# Patient Record
Sex: Female | Born: 2005 | Race: Black or African American | Hispanic: No | Marital: Single | Smoking: Never smoker
Health system: Southern US, Community
[De-identification: ages and names within clinical notes are randomized; demographics above are authoritative.]

## PROBLEM LIST (undated history)

## (undated) ENCOUNTER — Emergency Department (HOSPITAL_COMMUNITY): Admission: EM | Payer: Medicaid Other | Source: Home / Self Care

## (undated) DIAGNOSIS — J4520 Mild intermittent asthma, uncomplicated: Secondary | ICD-10-CM

## (undated) DIAGNOSIS — D573 Sickle-cell trait: Secondary | ICD-10-CM

## (undated) DIAGNOSIS — R51 Headache: Secondary | ICD-10-CM

## (undated) DIAGNOSIS — R29898 Other symptoms and signs involving the musculoskeletal system: Secondary | ICD-10-CM

## (undated) DIAGNOSIS — R519 Headache, unspecified: Secondary | ICD-10-CM

## (undated) HISTORY — DX: Other symptoms and signs involving the musculoskeletal system: R29.898

## (undated) HISTORY — DX: Mild intermittent asthma, uncomplicated: J45.20

## (undated) HISTORY — DX: Headache, unspecified: R51.9

## (undated) HISTORY — DX: Sickle-cell trait: D57.3

## (undated) HISTORY — DX: Headache: R51

---
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
## 2024-07-07 ENCOUNTER — Encounter: Payer: Self-pay | Admitting: *Deleted

## 2024-07-17 ENCOUNTER — Ambulatory Visit: Payer: Self-pay

## 2024-07-17 VITALS — BP 105/72 | HR 102

## 2024-07-17 DIAGNOSIS — R300 Dysuria: Secondary | ICD-10-CM

## 2024-07-17 LAB — POCT URINALYSIS DIPSTICK
Bilirubin, UA: NEGATIVE
Glucose, UA: NEGATIVE
Protein, UA: NEGATIVE
Spec Grav, UA: 1.02 (ref 1.010–1.025)
Urobilinogen, UA: 0.2 U/dL
pH, UA: 7.5 (ref 5.0–8.0)

## 2024-07-17 MED ORDER — NITROFURANTOIN MONOHYD MACRO 100 MG PO CAPS: 100.0000 mg | ORAL_CAPSULE | Freq: Two times a day (BID) | ORAL | 0 refills | Status: DC

## 2024-07-17 NOTE — Progress Notes (Signed)
 SUBJECTIVE: Candice Powell is a 18 y.o. female who complains of urinary frequency, urgency and dysuria x 3 days, without flank pain, fever, chills, or abnormal vaginal discharge or bleeding.   OBJECTIVE: Appears well, in no apparent distress.  Vital signs are normal. Urine dipstick shows positive for nitrates, positive for leukocytes, and positive for ketones.    ASSESSMENT: Dysuria  PLAN: Treatment per orders.  Call or return to clinic prn if these symptoms worsen or fail to improve as anticipated.

## 2024-07-20 LAB — URINE CULTURE

## 2024-07-21 ENCOUNTER — Ambulatory Visit: Payer: Self-pay | Admitting: Obstetrics and Gynecology

## 2024-08-12 ENCOUNTER — Emergency Department (HOSPITAL_COMMUNITY)
Admission: EM | Admit: 2024-08-12 | Discharge: 2024-08-12 | Disposition: A | Discharge status: Home / Self Care | Attending: Emergency Medicine | Admitting: Emergency Medicine

## 2024-08-12 ENCOUNTER — Other Ambulatory Visit: Payer: Self-pay

## 2024-08-12 ENCOUNTER — Encounter (HOSPITAL_COMMUNITY): Payer: Self-pay | Admitting: Pharmacy Technician

## 2024-08-12 DIAGNOSIS — E876 Hypokalemia: Secondary | ICD-10-CM | POA: Insufficient documentation

## 2024-08-12 DIAGNOSIS — J45909 Unspecified asthma, uncomplicated: Secondary | ICD-10-CM | POA: Diagnosis not present

## 2024-08-12 DIAGNOSIS — R112 Nausea with vomiting, unspecified: Secondary | ICD-10-CM | POA: Insufficient documentation

## 2024-08-12 LAB — COMPREHENSIVE METABOLIC PANEL WITH GFR
ALT: 7 U/L (ref 0–44)
AST: 13 U/L — ABNORMAL LOW (ref 15–41)
Albumin: 4.1 g/dL (ref 3.5–5.0)
Alkaline Phosphatase: 54 U/L (ref 38–126)
Anion gap: 10 (ref 5–15)
BUN: 7 mg/dL (ref 6–20)
CO2: 24 mmol/L (ref 22–32)
Calcium: 9.3 mg/dL (ref 8.9–10.3)
Chloride: 106 mmol/L (ref 98–111)
Creatinine, Ser: 0.79 mg/dL (ref 0.44–1.00)
GFR, Estimated: >60 mL/min (ref >60)
Glucose, Bld: 111 mg/dL — ABNORMAL HIGH (ref 70–99)
Potassium: 3.4 mmol/L — ABNORMAL LOW (ref 3.5–5.1)
Sodium: 140 mmol/L (ref 135–145)
Total Bilirubin: 0.6 mg/dL (ref 0.0–1.2)
Total Protein: 7.5 g/dL (ref 6.5–8.1)

## 2024-08-12 LAB — CBC
HCT: 35.7 % — ABNORMAL LOW (ref 36.0–46.0)
Hemoglobin: 11.6 g/dL — ABNORMAL LOW (ref 12.0–15.0)
MCH: 25.9 pg — ABNORMAL LOW (ref 26.0–34.0)
MCHC: 32.5 g/dL (ref 30.0–36.0)
MCV: 79.7 fL — ABNORMAL LOW (ref 80.0–100.0)
Platelets: 301 K/uL (ref 150–400)
RBC: 4.48 MIL/uL (ref 3.87–5.11)
RDW: 13.8 % (ref 11.5–15.5)
WBC: 6.9 K/uL (ref 4.0–10.5)
nRBC: 0 % (ref 0.0–0.2)

## 2024-08-12 LAB — HCG, SERUM, QUALITATIVE: Preg, Serum: NEGATIVE

## 2024-08-12 LAB — LIPASE, BLOOD: Lipase: 23 U/L (ref 11–51)

## 2024-08-12 MED ORDER — ONDANSETRON 4 MG PO TBDP: 4.0000 mg | ORAL_TABLET | Freq: Three times a day (TID) | ORAL | 0 refills | Status: AC | PRN

## 2024-08-12 MED ORDER — SODIUM CHLORIDE 0.9 % IV BOLUS
1000.0000 mL | Freq: Once | INTRAVENOUS | Status: AC
  Administered 2024-08-12: 1000 mL via INTRAVENOUS

## 2024-08-12 MED ORDER — ONDANSETRON HCL 4 MG/2ML IJ SOLN
4.0000 mg | Freq: Once | INTRAMUSCULAR | Status: AC
  Administered 2024-08-12: 4 mg via INTRAVENOUS
  Filled 2024-08-12: qty 2

## 2024-08-12 NOTE — ED Provider Notes (Signed)
 [place] EMERGENCY DEPARTMENT AT [facility] Provider Note   CSN: [id number] Arrival date & time: 08/12/24  1448     Patient presents with: Emesis   Candice Powell is a 18 y.o. female with a past medical history of asthma, sickle cell trait, migraines presents Emergency Department for evaluation of vomiting one hour following eating [place] shrimp Alfred[employer] at [employer] today at noon.  Reports that she has vomited 7 times since eating that.  No diarrhea nor fevers.  No complaints prior to today.  Last BM was today and normal.  Passing gas.  Denies urinary symptoms, abdominal pain, vaginal symptoms    Emesis      Prior to Admission medications   Medication Sig Start Date End Date Taking? Authorizing Provider  ondansetron  (ZOFRAN -ODT) 4 MG disintegrating tablet Take 1 tablet (4 mg total) by mouth every 8 (eight) hours as needed for nausea or vomiting. 08/12/24  Yes Minnie Tinnie BRAVO, PA  fexofenadine  (ALLEGRA ) 60 MG tablet Take 1 tablet (60 mg total) by mouth 2 (two) times daily. 03/01/22 03/31/22  Ettie Gull, MD  fluticasone  (FLONASE ) 50 MCG/ACT nasal spray Place 2 sprays into both nostrils daily. Patient not taking: Reported on 05/19/2022 09/02/21   Richad Jon HERO, NP  nitrofurantoin, macrocrystal-monohydrate, (MACROBID) 100 MG capsule Take 1 capsule (100 mg total) by mouth 2 (two) times daily. 07/17/24   Constant, Peggy, MD  norelgestromin -ethinyl estradiol  (XULANE) 150-35 MCG/24HR transdermal patch Place 1 patch onto the skin once a week. 04/07/23   Forlan, Nicole, NP  ondansetron  (ZOFRAN -ODT) 4 MG disintegrating tablet Take 1 tablet (4 mg total) by mouth every 8 (eight) hours as needed for nausea or vomiting. 12/28/23   Jerrol Agent, MD    Allergies: Patient has no known allergies.    Review of Systems  Gastrointestinal:  Positive for vomiting.    Updated Vital Signs BP 110/79   Pulse 72   Temp 98.2 F (36.8 C) (Oral)   Resp 16   Ht 5' 3 (1.6 m)   Wt 59.6 kg   SpO2  100%   BMI 23.28 kg/m   Physical Exam Vitals and nursing note reviewed.  Constitutional:      General: She is not in acute distress.    Appearance: Normal appearance.  HENT:     Head: Normocephalic and atraumatic.  Eyes:     Conjunctiva/sclera: Conjunctivae normal.  Cardiovascular:     Rate and Rhythm: Normal rate.  Pulmonary:     Effort: Pulmonary effort is normal. No respiratory distress.     Breath sounds: Normal breath sounds.  Abdominal:     General: Bowel sounds are normal. There is no distension.     Palpations: Abdomen is soft.     Tenderness: There is no abdominal tenderness.     Comments: No peritoneal signs  Skin:    Coloration: Skin is not jaundiced or pale.  Neurological:     Mental Status: She is alert and oriented to person, place, and time. Mental status is at baseline.     (all labs ordered are listed, but only abnormal results are displayed) Labs Reviewed  COMPREHENSIVE METABOLIC PANEL WITH GFR - Abnormal; Notable for the following components:      Result Value   Potassium 3.4 (*)    Glucose, Bld 111 (*)    AST 13 (*)    All other components within normal limits  CBC - Abnormal; Notable for the following components:   Hemoglobin 11.6 (*)  HCT 35.7 (*)    MCV 79.7 (*)    MCH 25.9 (*)    All other components within normal limits  LIPASE, BLOOD  HCG, SERUM, QUALITATIVE  URINALYSIS, ROUTINE W REFLEX MICROSCOPIC    EKG: None  Radiology: No results found.   Medications Ordered in the ED  ondansetron  (ZOFRAN ) injection 4 mg (4 mg Intravenous Given 08/12/24 1606)  sodium chloride  0.9 % bolus 1,000 mL (1,000 mLs Intravenous New Bag/Given 08/12/24 1830)                                    Medical Decision Making Amount and/or Complexity of Data Reviewed Labs: ordered.  Risk Prescription drug management.   Patient presents to the ED for concern of NV, this involves an extensive number of treatment options, and is a complaint that carries  with it a high risk of complications and morbidity.  The differential diagnosis includes influenza, pregnancy, gastroenteritis, appendicitis, infection, perforation, obstruction, hypokalemia, dehydration.  Not an exhaustive list   Co morbidities that complicate the patient evaluation  None   Additional history obtained:  Additional history obtained from Nursing   External records from outside source obtained and reviewed including triage RN note   Lab Tests:  I Ordered, and personally interpreted labs.  The pertinent results include:   Potassium 3.4    Medicines ordered and prescription drug management:  I ordered medication including zofran , ivf  for NV, hydration  Reevaluation of the patient after these medicines showed that the patient resolved I have reviewed the patients home medicines and have made adjustments as needed     Problem List / ED Course:  Vomiting Hemodynamically stable with no fever no tachycardia No signs of sepsis with no leukocytes, tachycardia, fever No abdominal tenderness nor peritoneal signs No acute emergent electrolyte abnormalities.  Potassium 3.4. hCG negative No vomiting since obtaining IVF, Zofran  Passes p.o. challenge Symptoms likely 2/2 gastroenteritis possible food poisoning Will provide Zofran  prescription Discussed importance of oral hydration at home and bowel rest    Reevaluation:  After the interventions noted above, I reevaluated the patient and found that they have :resolved    Dispostion:  After consideration of the diagnostic results and the patients response to treatment, I feel that the patent would benefit from outpatient management with symptomatic treatment.   Discussed ED workup, disposition, return to ED precautions with patient who expresses understanding agrees with plan.  All questions answered to their satisfaction.  They are agreeable to plan.  Discharge instructions provided on paperwork  Final  diagnoses:  Nausea and vomiting, unspecified vomiting type    ED Discharge Orders          Ordered    ondansetron  (ZOFRAN -ODT) 4 MG disintegrating tablet  Every 8 hours PRN        08/12/24 1935             Minnie Tinnie BRAVO, PA 08/13/24 1533    Franklyn Sid SAILOR, MD 08/13/24 1744

## 2024-08-12 NOTE — ED Triage Notes (Signed)
 Pt here with reports of possible food poisoning. States at at [employer] and had some shrimp approx 1 hour ago. Pt started vomiting right after eating.

## 2024-08-12 NOTE — Discharge Instructions (Addendum)
 Thank you for letting us  evaluate you today.  Your lab work was notable for a mildly decreased potassium of 3.4 (normal is above 3.5).  Your vital signs are within normal limits.  There is no fever.  Your white blood cell count is within normal limits.  You may develop some diarrhea from possible food poisoning or stomach bug and this is normal.  Please make sure to drink plenty of fluids even if you cannot eat.  You may drink water, Gatorade, Pedialyte, chicken broth  Return to Emergency Department if you experience severe debilitating abdominal pain, intractable vomiting cause you to be unable to keep fluids down, worsening symptoms

## 2024-08-12 NOTE — ED Provider Triage Note (Signed)
 Emergency Medicine Provider Triage Evaluation Note  Kassidy Dockendorf , a 18 y.o. female  was evaluated in triage.  Pt complains of who presents to the emergency department with a chief complaint of nausea and vomiting.  Patient was at [employer] earlier today and ate some shrimp, nausea and vomiting started almost immediately after this.  Denies fever, chills, diarrhea.  Currently denies any abdominal pain other than just from retching with vomiting.  States that she is otherwise healthy and denies prescription medications at home.  No hematemesis  Review of Systems  Positive: Nausea, vomiting Negative: Fever, chills, abdominal pain  Physical Exam  BP (!) 148/97 (BP Location: Right Arm)   Pulse 76   Temp 98.2 F (36.8 C) (Oral)   Resp 16   SpO2 100%  Gen:   Awake, no distress   Resp:  Normal effort  MSK:   Moves extremities without difficulty  Other:  Patient actively vomiting in triage, abdomen is soft and nontender  Medical Decision Making  Medically screening exam initiated at 3:55 PM.  Appropriate orders placed.  Lasharn Bufkin was informed that the remainder of the evaluation will be completed by another provider, this initial triage assessment does not replace that evaluation, and the importance of remaining in the ED until their evaluation is complete.  Orders: CBC, CMP, lipase, serum pregnancy, UA, Zofran  and fluids   Janetta Terrall FALCON, [place] 08/12/24 1557

## 2024-08-15 ENCOUNTER — Other Ambulatory Visit: Payer: Self-pay

## 2024-08-15 ENCOUNTER — Emergency Department (HOSPITAL_COMMUNITY)
Admission: EM | Admit: 2024-08-15 | Discharge: 2024-08-15 | Disposition: A | Discharge status: Home / Self Care | Attending: Emergency Medicine | Admitting: Emergency Medicine

## 2024-08-15 DIAGNOSIS — E876 Hypokalemia: Secondary | ICD-10-CM | POA: Insufficient documentation

## 2024-08-15 DIAGNOSIS — E860 Dehydration: Secondary | ICD-10-CM | POA: Diagnosis not present

## 2024-08-15 DIAGNOSIS — R112 Nausea with vomiting, unspecified: Secondary | ICD-10-CM | POA: Insufficient documentation

## 2024-08-15 DIAGNOSIS — R001 Bradycardia, unspecified: Secondary | ICD-10-CM | POA: Diagnosis not present

## 2024-08-15 LAB — COMPREHENSIVE METABOLIC PANEL WITH GFR
ALT: 9 U/L (ref 0–44)
AST: 14 U/L — ABNORMAL LOW (ref 15–41)
Albumin: 4.3 g/dL (ref 3.5–5.0)
Alkaline Phosphatase: 50 U/L (ref 38–126)
Anion gap: 15 (ref 5–15)
BUN: 10 mg/dL (ref 6–20)
CO2: 18 mmol/L — ABNORMAL LOW (ref 22–32)
Calcium: 9.1 mg/dL (ref 8.9–10.3)
Chloride: 105 mmol/L (ref 98–111)
Creatinine, Ser: 0.89 mg/dL (ref 0.44–1.00)
GFR, Estimated: >60 mL/min (ref >60)
Glucose, Bld: 96 mg/dL (ref 70–99)
Potassium: 3 mmol/L — ABNORMAL LOW (ref 3.5–5.1)
Sodium: 138 mmol/L (ref 135–145)
Total Bilirubin: 1.2 mg/dL (ref 0.0–1.2)
Total Protein: 7.5 g/dL (ref 6.5–8.1)

## 2024-08-15 LAB — CBC
HCT: 36.3 % (ref 36.0–46.0)
Hemoglobin: 11.9 g/dL — ABNORMAL LOW (ref 12.0–15.0)
MCH: 26.2 pg (ref 26.0–34.0)
MCHC: 32.8 g/dL (ref 30.0–36.0)
MCV: 79.8 fL — ABNORMAL LOW (ref 80.0–100.0)
Platelets: 277 K/uL (ref 150–400)
RBC: 4.55 MIL/uL (ref 3.87–5.11)
RDW: 13.6 % (ref 11.5–15.5)
WBC: 9.1 K/uL (ref 4.0–10.5)
nRBC: 0 % (ref 0.0–0.2)

## 2024-08-15 LAB — HCG, SERUM, QUALITATIVE: Preg, Serum: NEGATIVE

## 2024-08-15 MED ORDER — ONDANSETRON HCL 4 MG/2ML IJ SOLN
4.0000 mg | Freq: Once | INTRAMUSCULAR | Status: AC
  Administered 2024-08-15: 4 mg via INTRAVENOUS

## 2024-08-15 MED ORDER — ONDANSETRON HCL 4 MG/2ML IJ SOLN
INTRAMUSCULAR | Status: AC
  Filled 2024-08-15: qty 2

## 2024-08-15 MED ORDER — POTASSIUM CHLORIDE CRYS ER 20 MEQ PO TBCR
20.0000 meq | EXTENDED_RELEASE_TABLET | Freq: Once | ORAL | Status: AC
  Administered 2024-08-15: 20 meq via ORAL
  Filled 2024-08-15: qty 1

## 2024-08-15 MED ORDER — SODIUM CHLORIDE 0.9 % IV BOLUS
1000.0000 mL | Freq: Once | INTRAVENOUS | Status: AC
  Administered 2024-08-15: 1000 mL via INTRAVENOUS

## 2024-08-15 NOTE — ED Triage Notes (Signed)
 Pt was seem a few days ago due to vomiting and nausea. Was prescribed zofran  and last night and today felt nauseated and has been taking zofran  as prescribed. This morning felt heart palpitations after taking zofran . Pt states she vomited in waiting room when she got up. Pt denies any pain at this time.

## 2024-08-15 NOTE — ED Provider Notes (Signed)
 [place] EMERGENCY DEPARTMENT AT [facility] Provider Note   CSN: [id number] Arrival date & time: 08/15/24  1136     Patient presents with: Emesis   Candice Powell is a 18 y.o. female.   Patient complains of nausea and vomiting.  Patient took Zofran  at home earlier today.  Patient reports she has not been drinking very much for fluids.  Patient reports she was seen yesterday for the same.  Patient denies any diarrhea.  Patient states she has vomited twice today.  Patient denies any fever she denies any chills.  Patient is not having any diarrhea.  Patient currently does not have any abdominal pain.  Patient denies any burning with urination  The history is provided by the patient. No language interpreter was used.  Emesis      Prior to Admission medications   Medication Sig Start Date End Date Taking? Authorizing Provider  fexofenadine  (ALLEGRA ) 60 MG tablet Take 1 tablet (60 mg total) by mouth 2 (two) times daily. 03/01/22 03/31/22  Ettie Gull, MD  fluticasone  (FLONASE ) 50 MCG/ACT nasal spray Place 2 sprays into both nostrils daily. Patient not taking: Reported on 05/19/2022 09/02/21   Richad Jon HERO, NP  nitrofurantoin, macrocrystal-monohydrate, (MACROBID) 100 MG capsule Take 1 capsule (100 mg total) by mouth 2 (two) times daily. 07/17/24   Constant, Peggy, MD  norelgestromin -ethinyl estradiol  (XULANE) 150-35 MCG/24HR transdermal patch Place 1 patch onto the skin once a week. 04/07/23   Forlan, Nicole, NP  ondansetron  (ZOFRAN -ODT) 4 MG disintegrating tablet Take 1 tablet (4 mg total) by mouth every 8 (eight) hours as needed for nausea or vomiting. 12/28/23   Jerrol Agent, MD  ondansetron  (ZOFRAN -ODT) 4 MG disintegrating tablet Take 1 tablet (4 mg total) by mouth every 8 (eight) hours as needed for nausea or vomiting. 08/12/24   Minnie Tinnie BRAVO, PA    Allergies: Patient has no known allergies.    Review of Systems  Gastrointestinal:  Positive for vomiting.  All other  systems reviewed and are negative.   Updated Vital Signs BP (!) 141/91   Pulse (!) 104   Temp 97.7 F (36.5 C) (Oral)   Resp 20   LMP  (Approximate)   SpO2 99%   Physical Exam Vitals and nursing note reviewed.  Constitutional:      Appearance: She is well-developed.  HENT:     Head: Normocephalic.  Cardiovascular:     Rate and Rhythm: Normal rate.  Pulmonary:     Effort: Pulmonary effort is normal.  Abdominal:     General: There is no distension.  Musculoskeletal:        General: Normal range of motion.     Cervical back: Normal range of motion.  Skin:    General: Skin is warm.  Neurological:     General: No focal deficit present.     Mental Status: She is alert and oriented to person, place, and time.     (all labs ordered are listed, but only abnormal results are displayed) Labs Reviewed  CBC - Abnormal; Notable for the following components:      Result Value   Hemoglobin 11.9 (*)    MCV 79.8 (*)    All other components within normal limits  COMPREHENSIVE METABOLIC PANEL WITH GFR - Abnormal; Notable for the following components:   Potassium 3.0 (*)    CO2 18 (*)    AST 14 (*)    All other components within normal limits  HCG, SERUM, QUALITATIVE  EKG: EKG Interpretation Date/Time:  Tuesday August 15 2024 13:12:02 EDT Ventricular Rate:  58 PR Interval:  126 QRS Duration:  84 QT Interval:  394 QTC Calculation: 386 R Axis:   78  Text Interpretation: Sinus bradycardia with sinus arrhythmia Otherwise normal ECG No previous ECGs available Confirmed by Park, Patsy ([id number]) on 08/15/2024 4:51:13 PM  Radiology: No results found.   Procedures   Medications Ordered in the ED  ondansetron  (ZOFRAN ) injection 4 mg (4 mg Intravenous Given 08/15/24 1300)  potassium chloride SA (KLOR-CON M) CR tablet 20 mEq (20 mEq Oral Given 08/15/24 1536)  sodium chloride  0.9 % bolus 1,000 mL (1,000 mLs Intravenous New Bag/Given 08/15/24 1538)                                     Medical Decision Making Patient complains of nausea and vomiting.  Patient was seen yesterday for the same.  Amount and/or Complexity of Data Reviewed Independent Historian:     Details: Patient is here with her mother who is supportive Labs: ordered. Decision-making details documented in ED Course.    Details: Labs ordered reviewed and interpreted potassium is 3.0.  Risk Prescription drug management. Risk Details: Patient given IV fluids x 1 L potassium 20 mEq p.o.  Patient reports feeling much better.  Patient is given a note to be out of work tomorrow and she is discharged in stable condition.  Patient is encouraged to drink plenty of fluids to stay well-hydrated.        Final diagnoses:  Dehydration  Hypokalemia    ED Discharge Orders     None      An After Visit Summary was printed and given to the patient.     Flint Sonny POUR, PA-C 08/15/24 1741    Yolande Lamar BROCKS, MD 08/18/24 [phone]

## 2024-08-15 NOTE — ED Notes (Signed)
Called x 1. No answer in lobby.  

## 2024-08-15 NOTE — ED Triage Notes (Signed)
 Pt states she does smoke marijuana and vapes. Mother at pt side explaining that she doesn't always know where she is getting her marijuana. Mother lecturing patient during triage. Pt dry heaving.

## 2024-08-15 NOTE — ED Triage Notes (Addendum)
 Pt c/o palpitations, reports she feels like her heart is beating really fast. Took zofran  and thinks she might have taken doses too close together.

## 2024-08-15 NOTE — ED Provider Triage Note (Signed)
 Emergency Medicine Provider Triage Evaluation Note  Candice Powell , a 18 y.o. female  was evaluated in triage.  Pt complains of nausea vomiting, no abd pain. Was just seen for similar sx.  Review of Systems  Positive: Nausea and vomiting Negative: Fever   Physical Exam  BP (!) 141/91   Pulse (!) 104   Temp 97.8 F (36.6 C) (Oral)   Resp 20   LMP  (Approximate)   SpO2 99%  Gen:   Awake, retching  Resp:  Normal effort  MSK:   Moves extremities without difficulty  Other:  Abd soft nttp   Medical Decision Making  Medically screening exam initiated at 12:49 PM.  Appropriate orders placed.  Ilina Xu was informed that the remainder of the evaluation will be completed by another provider, this initial triage assessment does not replace that evaluation, and the importance of remaining in the ED until their evaluation is complete.  Labs, EKG, zofran  IV    Roderick Calo S, [place] 08/15/24 1250

## 2024-08-15 NOTE — Discharge Instructions (Addendum)
 Make sure you are drinking plenty of fluids.  Your potassium was low today.  The medication you were given today should correct this. Take the nausea medication as needed.  Return if any problems.

## 2024-08-15 NOTE — ED Notes (Signed)
 Dc instructions reviewed with pt no question or concern at this time

## 2024-09-21 ENCOUNTER — Ambulatory Visit: Payer: Self-pay | Admitting: Nurse Practitioner

## 2024-09-21 ENCOUNTER — Ambulatory Visit: Admitting: Nurse Practitioner

## 2024-09-21 VITALS — BP 110/76 | HR 82 | Temp 97.9°F | Ht 63.0 in | Wt 117.1 lb

## 2024-09-21 DIAGNOSIS — D573 Sickle-cell trait: Secondary | ICD-10-CM

## 2024-09-21 DIAGNOSIS — Z23 Encounter for immunization: Secondary | ICD-10-CM

## 2024-09-21 DIAGNOSIS — Z91018 Allergy to other foods: Secondary | ICD-10-CM | POA: Diagnosis not present

## 2024-09-21 DIAGNOSIS — E876 Hypokalemia: Secondary | ICD-10-CM | POA: Diagnosis not present

## 2024-09-21 DIAGNOSIS — Z131 Encounter for screening for diabetes mellitus: Secondary | ICD-10-CM | POA: Diagnosis not present

## 2024-09-21 DIAGNOSIS — Z0001 Encounter for general adult medical examination with abnormal findings: Secondary | ICD-10-CM

## 2024-09-21 DIAGNOSIS — D649 Anemia, unspecified: Secondary | ICD-10-CM

## 2024-09-21 DIAGNOSIS — L910 Hypertrophic scar: Secondary | ICD-10-CM | POA: Diagnosis not present

## 2024-09-21 LAB — CBC
HCT: 32.8 % — ABNORMAL LOW (ref 36.0–49.0)
Hemoglobin: 11.1 g/dL — ABNORMAL LOW (ref 12.0–16.0)
MCHC: 33.7 g/dL (ref 31.0–37.0)
MCV: 80.3 fl (ref 78.0–98.0)
Platelets: 260 K/uL (ref 150.0–575.0)
RBC: 4.09 Mil/uL (ref 3.80–5.70)
RDW: 14.4 % (ref 11.4–15.5)
WBC: 7.6 K/uL (ref 4.5–13.5)

## 2024-09-21 LAB — COMPREHENSIVE METABOLIC PANEL WITH GFR
ALT: 6 U/L (ref 0–35)
AST: 12 U/L (ref 0–37)
Albumin: 4.3 g/dL (ref 3.5–5.2)
Alkaline Phosphatase: 56 U/L (ref 47–119)
BUN: 8 mg/dL (ref 6–23)
CO2: 29 meq/L (ref 19–32)
Calcium: 9 mg/dL (ref 8.4–10.5)
Chloride: 106 meq/L (ref 96–112)
Creatinine, Ser: 0.62 mg/dL (ref 0.40–1.20)
GFR: 129.46 mL/min (ref >60.00)
Glucose, Bld: 89 mg/dL (ref 70–99)
Potassium: 3.4 meq/L — ABNORMAL LOW (ref 3.5–5.1)
Sodium: 142 meq/L (ref 135–145)
Total Bilirubin: 0.4 mg/dL (ref 0.2–1.2)
Total Protein: 6.9 g/dL (ref 6.0–8.3)

## 2024-09-21 LAB — HEMOGLOBIN A1C: Hgb A1c MFr Bld: 4.9 % (ref 4.6–6.5)

## 2024-09-21 LAB — TSH: TSH: 3.07 u[IU]/mL (ref 0.40–5.00)

## 2024-09-21 NOTE — Assessment & Plan Note (Signed)
 Labs ordered, further recommendations may be made based upon these results

## 2024-09-21 NOTE — Assessment & Plan Note (Signed)
 Keloids of right ear Keloids present on the right ear with two confirmed and a third suspected. - Referred to dermatology for evaluation and management.

## 2024-09-21 NOTE — Assessment & Plan Note (Signed)
 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 - Referred to allergist for evaluation of suspected peach allergy.

## 2024-09-21 NOTE — Assessment & Plan Note (Signed)
 Flu shot administered, VIS provided.

## 2024-09-21 NOTE — Progress Notes (Signed)
 New Patient Office Visit  Subjective    Patient ID: Candice Powell, female    DOB: [birth year]  Age: 18 y.o. MRN: [id number]  CC:  Chief Complaint  Patient presents with   Medical Management of Chronic Issues    TOC, pt want referral to see derma for keloid and also eating issue ( unable to keep food down). Referral to see an allergist     HPI Candice Powell presents to establish care Discussed the use of AI scribe software for clinical note transcription with the patient, who gave verbal consent to proceed.  History of Present Illness Candice Powell is a 18 year old female who presents for a routine visit and dermatology referral. She is accompanied by her mother.  Cutaneous lesions - Keloids present on right ear - Seeking dermatology referral for further evaluation and management  Asthma - History of asthma - No current symptoms reported  Sickle cell trait - Known sickle cell trait - No current symptoms or complications reported  Food allergies - Possible peach allergy with prior lip swelling and throat itching after peach ingestion - No reactions to other fruits - Avoids peaches  Gastrointestinal symptoms - Vomiting occurred a few weeks ago after eating shrimp [employer] - Found to be dehydrated with low potassium at that time, treated in ER - Vomiting has now resolved  Immunization status - Would like flu shot administered.  - No known allergies to flu vaccine, eggs, or latex    Outpatient Encounter Medications as of 09/21/2024  Medication Sig   ondansetron  (ZOFRAN -ODT) 4 MG disintegrating tablet Take 1 tablet (4 mg total) by mouth every 8 (eight) hours as needed for nausea or vomiting.   norelgestromin -ethinyl estradiol  (XULANE) 150-35 MCG/24HR transdermal patch Place 1 patch onto the skin once a week. (Patient not taking: Reported on 09/21/2024)   [DISCONTINUED] fexofenadine  (ALLEGRA ) 60 MG tablet Take 1 tablet (60 mg total) by mouth 2 (two) times daily.    [DISCONTINUED] fluticasone  (FLONASE ) 50 MCG/ACT nasal spray Place 2 sprays into both nostrils daily. (Patient not taking: Reported on 05/19/2022)   [DISCONTINUED] nitrofurantoin , macrocrystal-monohydrate, (MACROBID ) 100 MG capsule Take 1 capsule (100 mg total) by mouth 2 (two) times daily.   [DISCONTINUED] ondansetron  (ZOFRAN -ODT) 4 MG disintegrating tablet Take 1 tablet (4 mg total) by mouth every 8 (eight) hours as needed for nausea or vomiting. (Patient not taking: Reported on 09/21/2024)   No facility-administered encounter medications on file as of 09/21/2024.    Past Medical History:  Diagnosis Date   Asthma, mild intermittent 06/30/2012   Has albuterol  MDI with spacer and mask. Seldom uses it now - when younger needed frequent albuterol  nebulizer   Growing pains 09/24/2011   Headache    Sickle cell trait 09/24/2011    No past surgical history on file.  Family History  Problem Relation Age of Onset   Diabetes Maternal Grandmother    Heart disease Maternal Grandmother    Hyperlipidemia Maternal Grandmother    Hypertension Maternal Grandmother     Social History   Socioeconomic History   Marital status: Single    Spouse name: Not on file   Number of children: Not on file   Years of education: Not on file   Highest education level: Not on file  Occupational History   Not on file  Tobacco Use   Smoking status: Never    Passive exposure: Current   Smokeless tobacco: Never  Vaping Use   Vaping status: Every Day  Substance and  Sexual Activity   Alcohol use: No   Drug use: No   Sexual activity: Never  Other Topics Concern   Not on file  Social History Narrative   Lives at home with mother, brother, and stepfather.  Attends [employer] and is in eighth grade.  Involved in dance.   Social Drivers of Corporate Investment Banker Strain: Not on file  Food Insecurity: Not on file  Transportation Needs: Not on file  Physical Activity: Not on file  Stress: Not on  file  Social Connections: Not on file  Intimate Partner Violence: Not on file    ROS: see HPI      Objective    BP 110/76   Pulse 82   Temp 97.9 F (36.6 C) (Temporal)   Ht 5' 3 (1.6 m)   Wt 117 lb 2 oz (53.1 kg)   SpO2 98%   Breastfeeding No   BMI 20.75 kg/m   Physical Exam Vitals reviewed.  Constitutional:      General: She is not in acute distress.    Appearance: Normal appearance.  HENT:     Head: Normocephalic and atraumatic.     Ears:   Cardiovascular:     Rate and Rhythm: Normal rate and regular rhythm.     Pulses: Normal pulses.     Heart sounds: Normal heart sounds.  Pulmonary:     Effort: Pulmonary effort is normal.     Breath sounds: Normal breath sounds.  Skin:    General: Skin is warm and dry.  Neurological:     General: No focal deficit present.     Mental Status: She is alert and oriented to person, place, and time.  Psychiatric:        Mood and Affect: Mood normal.        Behavior: Behavior normal.        Judgment: Judgment normal.         Assessment & Plan:   Problem List Items Addressed This Visit       Musculoskeletal and Integument   Keloid   Keloids of right ear Keloids present on the right ear with two confirmed and a third suspected. - Referred to dermatology for evaluation and management.      Relevant Orders   Ambulatory referral to Dermatology     Other   Sickle cell trait - Primary   Labs ordered, further recommendations may be made based upon these results       Relevant Orders   CBC   Comprehensive metabolic panel with GFR   Hemoglobin A1c   TSH   Encounter for general adult medical examination with abnormal findings   Labs ordered, further recommendations may be made based upon these results       Relevant Orders   CBC   Comprehensive metabolic panel with GFR   Hemoglobin A1c   TSH   Hypokalemia   Labs ordered, further recommendations may be made based upon these results       Relevant Orders    CBC   Comprehensive metabolic panel with GFR   Hemoglobin A1c   TSH   Diabetes mellitus screening   Labs ordered, further recommendations may be made based upon these results       Relevant Orders   CBC   Comprehensive metabolic panel with GFR   Hemoglobin A1c   TSH   Need for vaccination   Flu shot administered, VIS provided      Relevant  Orders   Flu vaccine trivalent PF, 6mos and older(Flulaval,Afluria,Fluarix,Fluzone) (Completed)   Food allergy   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 - Referred to allergist for evaluation of suspected peach allergy.      Relevant Orders   Ambulatory referral to Allergy  Assessment and Plan Assessment & Plan Keloids of right ear Keloids present on the right ear with two confirmed and a third suspected. - Referred to dermatology for evaluation and management.  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 - Referred to allergist for evaluation of suspected peach allergy.  Nausea and vomiting, resolved Recent episode of nausea and vomiting attributed to dehydration. Symptoms resolved. - Ordered labs to recheck potassium levels.     Return in about 3 months (around 12/20/2024) for CPE with Darvell Monteforte.   Candice FORBES Pereyra, NP

## 2024-09-25 ENCOUNTER — Other Ambulatory Visit: Payer: Self-pay | Admitting: Nurse Practitioner

## 2024-09-25 DIAGNOSIS — E876 Hypokalemia: Secondary | ICD-10-CM

## 2024-09-25 MED ORDER — POTASSIUM CHLORIDE CRYS ER 10 MEQ PO TBCR: 10.0000 meq | EXTENDED_RELEASE_TABLET | Freq: Every day | ORAL | 0 refills | Status: AC
